# Patient Record
Sex: Male | Born: 1965 | Race: Black or African American | Hispanic: No | Marital: Married
Health system: Southern US, Community
[De-identification: ages and names within clinical notes are randomized; demographics above are authoritative.]

---

## 2018-09-02 ENCOUNTER — Other Ambulatory Visit: Payer: Self-pay

## 2018-09-02 ENCOUNTER — Emergency Department (HOSPITAL_COMMUNITY): Payer: PRIVATE HEALTH INSURANCE

## 2018-09-02 ENCOUNTER — Emergency Department (HOSPITAL_COMMUNITY)
Admission: EM | Admit: 2018-09-02 | Discharge: 2018-09-02 | Disposition: A | Discharge status: Home / Self Care | Payer: PRIVATE HEALTH INSURANCE | Attending: Emergency Medicine | Admitting: Emergency Medicine

## 2018-09-02 DIAGNOSIS — S20312A Abrasion of left front wall of thorax, initial encounter: Secondary | ICD-10-CM | POA: Insufficient documentation

## 2018-09-02 DIAGNOSIS — Z23 Encounter for immunization: Secondary | ICD-10-CM | POA: Insufficient documentation

## 2018-09-02 DIAGNOSIS — S50312A Abrasion of left elbow, initial encounter: Secondary | ICD-10-CM | POA: Insufficient documentation

## 2018-09-02 DIAGNOSIS — T07XXXA Unspecified multiple injuries, initial encounter: Secondary | ICD-10-CM

## 2018-09-02 DIAGNOSIS — Y9389 Activity, other specified: Secondary | ICD-10-CM | POA: Diagnosis not present

## 2018-09-02 DIAGNOSIS — Y999 Unspecified external cause status: Secondary | ICD-10-CM | POA: Insufficient documentation

## 2018-09-02 DIAGNOSIS — Y908 Blood alcohol level of 240 mg/100 ml or more: Secondary | ICD-10-CM | POA: Diagnosis not present

## 2018-09-02 DIAGNOSIS — S0993XA Unspecified injury of face, initial encounter: Secondary | ICD-10-CM | POA: Diagnosis present

## 2018-09-02 DIAGNOSIS — S022XXB Fracture of nasal bones, initial encounter for open fracture: Secondary | ICD-10-CM | POA: Diagnosis not present

## 2018-09-02 DIAGNOSIS — Z789 Other specified health status: Secondary | ICD-10-CM

## 2018-09-02 DIAGNOSIS — Y92415 Exit ramp or entrance ramp of street or highway as the place of occurrence of the external cause: Secondary | ICD-10-CM | POA: Insufficient documentation

## 2018-09-02 DIAGNOSIS — F10929 Alcohol use, unspecified with intoxication, unspecified: Secondary | ICD-10-CM | POA: Diagnosis not present

## 2018-09-02 LAB — RAPID URINE DRUG SCREEN, HOSP PERFORMED
Amphetamines: NOT DETECTED
BARBITURATES: NOT DETECTED
Benzodiazepines: NOT DETECTED
Cocaine: NOT DETECTED
Opiates: NOT DETECTED
Tetrahydrocannabinol: NOT DETECTED

## 2018-09-02 LAB — ETHANOL: Alcohol, Ethyl (B): 221 mg/dL — ABNORMAL HIGH (ref <10)

## 2018-09-02 MED ORDER — IBUPROFEN 600 MG PO TABS: 600.0000 mg | ORAL_TABLET | Freq: Four times a day (QID) | ORAL | 0 refills | Status: AC | PRN

## 2018-09-02 MED ORDER — TETANUS-DIPHTH-ACELL PERTUSSIS 5-2.5-18.5 LF-MCG/0.5 IM SUSP
0.5000 mL | Freq: Once | INTRAMUSCULAR | Status: AC
  Administered 2018-09-02: 0.5 mL via INTRAMUSCULAR
  Filled 2018-09-02: qty 0.5

## 2018-09-02 MED ORDER — CYCLOBENZAPRINE HCL 10 MG PO TABS: 10.0000 mg | ORAL_TABLET | Freq: Two times a day (BID) | ORAL | 0 refills | Status: AC | PRN
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
## 2018-09-02 NOTE — ED Notes (Signed)
Pt ambulated self efficiently with no difficulties.
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
